# Patient Record
Sex: Male | Born: 1960 | Race: White | Hispanic: No | Marital: Married | Smoking: Never smoker
Health system: Southern US, Community
[De-identification: ages and names within clinical notes are randomized; demographics above are authoritative.]

## PROBLEM LIST (undated history)

## (undated) DIAGNOSIS — R079 Chest pain, unspecified: Secondary | ICD-10-CM

## (undated) DIAGNOSIS — I10 Essential (primary) hypertension: Secondary | ICD-10-CM

## (undated) DIAGNOSIS — Z8249 Family history of ischemic heart disease and other diseases of the circulatory system: Secondary | ICD-10-CM

## (undated) DIAGNOSIS — E7800 Pure hypercholesterolemia, unspecified: Secondary | ICD-10-CM

## (undated) HISTORY — DX: Family history of ischemic heart disease and other diseases of the circulatory system: Z82.49

## (undated) HISTORY — DX: Chest pain, unspecified: R07.9

---

## 2002-06-15 ENCOUNTER — Ambulatory Visit (HOSPITAL_BASED_OUTPATIENT_CLINIC_OR_DEPARTMENT_OTHER): Admission: RE | Admit: 2002-06-15 | Discharge: 2002-06-15 | Payer: Self-pay | Admitting: *Deleted

## 2002-07-03 ENCOUNTER — Encounter (HOSPITAL_COMMUNITY): Admission: RE | Admit: 2002-07-03 | Discharge: 2002-10-01 | Payer: Self-pay | Admitting: Internal Medicine

## 2002-07-04 ENCOUNTER — Encounter: Payer: Self-pay | Admitting: Internal Medicine

## 2003-07-29 ENCOUNTER — Emergency Department (HOSPITAL_COMMUNITY): Admission: EM | Admit: 2003-07-29 | Discharge: 2003-07-29 | Payer: Self-pay | Admitting: Emergency Medicine

## 2003-08-01 ENCOUNTER — Emergency Department (HOSPITAL_COMMUNITY): Admission: EM | Admit: 2003-08-01 | Discharge: 2003-08-01 | Payer: Self-pay | Admitting: Emergency Medicine

## 2003-08-02 ENCOUNTER — Observation Stay (HOSPITAL_COMMUNITY): Admission: AD | Admit: 2003-08-02 | Discharge: 2003-08-03 | Payer: Self-pay | Admitting: Urology

## 2003-08-04 ENCOUNTER — Ambulatory Visit (HOSPITAL_COMMUNITY): Admission: EM | Admit: 2003-08-04 | Discharge: 2003-08-04 | Payer: Self-pay | Admitting: Emergency Medicine

## 2006-05-19 ENCOUNTER — Emergency Department (HOSPITAL_COMMUNITY): Admission: EM | Admit: 2006-05-19 | Discharge: 2006-05-19 | Payer: Self-pay | Admitting: Family Medicine

## 2007-03-21 ENCOUNTER — Encounter: Admission: RE | Admit: 2007-03-21 | Discharge: 2007-03-21 | Payer: Self-pay | Admitting: Family Medicine

## 2010-09-12 NOTE — Op Note (Signed)
NAME:  Dennis Hines, Dennis Hines                        ACCOUNT NO.:  [id number]   MEDICAL RECORD NO.:  [id number]                   PATIENT TYPE:  EMS   LOCATION:  ED                                   FACILITY:  [facility]   PHYSICIAN:  Jamison Neighbor, M.D.               DATE OF BIRTH:  15-Dec-1960   DATE OF PROCEDURE:  DATE OF DISCHARGE:                                 OPERATIVE REPORT   PREOPERATIVE DIAGNOSIS:  Left ureteral obstruction, status post recent  ureteroscopy.   POSTOPERATIVE DIAGNOSIS:  Left ureteral obstruction, status post recent  ureteroscopy.   PROCEDURE:  Cystoscopy, left retrograde ureteroscopy, and double-J catheter  insertion.   SURGEON:  Jamison Neighbor, M.D.   ANESTHESIA:  General.   COMPLICATIONS:  None.   DRAINS:  A 6 French x 26 cm double-J catheter.   BRIEF HISTORY:  This 50 year old male underwent ureteroscopy earlier this  week by Dr. Vernie Ammons with apparently successful retrieval of stone material.  The patient apparently had an atraumatic experience, and it was felt that he  could get by with a stent.   At first, the patient did well, but then he began to develop a intractable  pain, which he felt was severe as to what he had prior to removal of the  stone.  The patient was given advice on how to handle this on an outpatient  basis, such as hot soaks, heating pads, anti-inflammatories, etc., but  presented to the emergency room stating that his pain was poorly controlled.  The decision was made to place the stent.  Patient understands the risks and  benefits of the procedure and specifically understands the complications and  discomfort sometimes associated with stents.  Full informed consent was  obtained.   PROCEDURE:  After successful induction of general anesthesia, the patient  was placed in the dorsal lithotomy position, prepped with Betadine, and  draped in the usual sterile fashion.  Cystoscopy was performed.  The urethra  was visualized in its  entirety.  __________ verumontanum.  There was little  if any prostatic enlargement.  The bladder was carefully inspected.  It was  free of any tumor or stones.  Both the orifices were normal in configuration  and location.  The left ureter, of course, did show postsurgical changes.  An attempt at retrograde was unsuccessful, as it was very tight, and a  urethral catheter could not be passed.  Pressure with a guidewire caused the  guidewire to exit the ureter.  The decision was made to place the  ureteroscope and find the appropriate passage.  The ureteroscope was  inserted into the ureter, and the passageway into the upper ureter  identified but it did appear there was clot material in the distal ureter as  well as some mucosa that was partially extracted.  The ureteroscope was  pushed past this point.  This opened everything up very nicely.  The  guidewire was then advanced under direct vision up into the kidney, where it  coiled normally.  The ureteroscope was withdrawn.  The cystoscope was back-  loaded over the wire, and the 6 French x 26 cm double-J catheter was passed  up to the kidney.  This was positioned so that it sat within the  pelvis as well as within the bladder.  The bladder was drained.  The patient  tolerated the procedure and was taken to the recovery room in good  condition.  He will be given a prescription for Lorcet Plus, Pyridium Plus,  and Septra DS and return to see Dr. Vernie Ammons in followup.                                               Jamison Neighbor, M.D.    RJE/MEDQ  D:  08/04/2003  T:  08/04/2003  Job:  [id number]   cc:   Veverly Fells. Vernie Ammons, M.D.  [address]  [place]  [place] [address]  Fax: [phone]

## 2010-09-12 NOTE — Op Note (Signed)
NAME:  DINNIS, ROG                        ACCOUNT NO.:  [id number]   MEDICAL RECORD NO.:  [id number]                   PATIENT TYPE:  OBV   LOCATION:  0484                                 FACILITY:  [facility]   PHYSICIAN:  Excell Seltzer. Annabell Howells, M.D.                 DATE OF BIRTH:  01/15/1961   DATE OF PROCEDURE:  08/02/2003  DATE OF DISCHARGE:                                 OPERATIVE REPORT   OPERATION/PROCEDURE:  Left ureteroscopic stone extraction.   PREOPERATIVE DIAGNOSIS:  Left urethrovesical junction stone.   POSTOPERATIVE DIAGNOSIS:  Left urethrovesical junction stone.   OPERATION/PROCEDURE:   ANESTHESIA:  General.   COMPLICATIONS:  None.   INDICATIONS:  Lorin Picket is a 50 year old white male with a left distal ureteral  stone which has caused fairly persistent symptoms over the last several  days.  He attempted conservative therapy to no avail and presented today  with increased pain and fever of 100.7.  It was felt that ureteroscopic  stone extraction was indicated.   DESCRIPTION OF PROCEDURE:  The patient was given Rocephin preoperatively,  was taken to the operating room where general anesthesia was induced.  He  was placed in the lithotomy position.  His perineum and genitalia were  prepped with Betadine solution and draped in the usual sterile fashion.   Cystoscopy was performed using a 22-French scope and a 12-degree lens,  examination revealed a normal urethra.  The external sphincter was intact.  The prostatic urethra was short without obstruction.  The bladder wall was  __________ without tumors, stones or inflammation.  No stones were seen in  the bladder.  Ureteral orifices were unremarkable.  The cystoscope was then  removed and a 6.4-French long ureteroscope was passed to the ureteral  orifice.  A guidewire was passed through the scope, up the ureteral orifice.  An attempt was made to pass the scope.  However, it was not successful due  to some narrowing of the  orifice.  I then removed the scope and passed the  inner sheath of 12/14 dilator over the wire, up the ureteral orifice to  dilate.  The ureteroscope was then passed alongside the wire.  The stone was  visualized without difficulty, grasped with a nitinol basket and removed  without any resistance.  The guidewire was then removed.  It was not felt  that a stent was necessary due to the minimal trauma associated with the  stone removal.  The cystoscope sheath was then reinserted.  The bladder was  drained.  The scope  was removed.  The patient was taken down from the lithotomy position.  His  anesthetic was reversed.  He was admitted to the recovery room in stable  condition.  There were no complications.  He will be kept overnight because  of his history of sleep apnea.  Excell Seltzer. Annabell Howells, M.D.    JJW/MEDQ  D:  08/02/2003  T:  08/03/2003  Job:  [id number]   cc:   Ernestina Penna, M.D.  [address] [place]  [place] [address]  Fax: [phone]

## 2011-04-27 ENCOUNTER — Emergency Department (HOSPITAL_COMMUNITY)
Admission: EM | Admit: 2011-04-27 | Discharge: 2011-04-27 | Disposition: A | Discharge status: Home / Self Care | Payer: Worker's Compensation | Attending: Emergency Medicine | Admitting: Emergency Medicine

## 2011-04-27 ENCOUNTER — Encounter: Payer: Self-pay | Admitting: Neurology

## 2011-04-27 DIAGNOSIS — E119 Type 2 diabetes mellitus without complications: Secondary | ICD-10-CM | POA: Insufficient documentation

## 2011-04-27 DIAGNOSIS — S51819A Laceration without foreign body of unspecified forearm, initial encounter: Secondary | ICD-10-CM

## 2011-04-27 DIAGNOSIS — S51809A Unspecified open wound of unspecified forearm, initial encounter: Secondary | ICD-10-CM | POA: Insufficient documentation

## 2011-04-27 DIAGNOSIS — W3189XA Contact with other specified machinery, initial encounter: Secondary | ICD-10-CM | POA: Insufficient documentation

## 2011-04-27 DIAGNOSIS — E7800 Pure hypercholesterolemia, unspecified: Secondary | ICD-10-CM | POA: Insufficient documentation

## 2011-04-27 DIAGNOSIS — I10 Essential (primary) hypertension: Secondary | ICD-10-CM | POA: Insufficient documentation

## 2011-04-27 HISTORY — DX: Essential (primary) hypertension: I10

## 2011-04-27 HISTORY — DX: Pure hypercholesterolemia, unspecified: E78.00

## 2011-04-27 MED ORDER — AMOXICILLIN-POT CLAVULANATE 875-125 MG PO TABS: 1.0000 | ORAL_TABLET | Freq: Two times a day (BID) | ORAL | Status: AC

## 2011-04-27 MED ORDER — HYDROCODONE-ACETAMINOPHEN 5-325 MG PO TABS: 1.0000 | ORAL_TABLET | Freq: Four times a day (QID) | ORAL | Status: AC | PRN

## 2011-04-27 NOTE — ED Notes (Signed)
PA Thayer Ohm, at bedside to suture

## 2011-04-27 NOTE — ED Notes (Signed)
Pt has laceration to right arm as result of meat saw. Pt reporting reached across saw and cut arm. Pt initially controlling bleeding with paper towel. Paper towels removed covered with gauze and wrapped loosely with Kerlex. Pt reports taking aspirin daily. Pt alert and oriented. Talking appropriately.

## 2011-04-27 NOTE — ED Provider Notes (Signed)
Medical screening examination/treatment/procedure(s) were performed by non-physician practitioner and as supervising physician I was immediately available for consultation/collaboration.  Toy Baker, MD 04/27/11 [phone]

## 2011-04-27 NOTE — ED Provider Notes (Signed)
History     CSN: [id number]  Arrival date & time 04/27/11  0865   First MD Initiated Contact with Patient 04/27/11 1013      Chief Complaint  Patient presents with  . Extremity Laceration    (Consider location/radiation/quality/duration/timing/severity/associated sxs/prior treatment) HPI Presents the emergency Dept. after a laceration from a meat saw.  Patient states that his tetanus shot is up-to-date.  Patient denies any numbness in his hand or weakness of the hand.  The laceration is located to the right lateral forearm.  There is minimal bleeding from the wound at this time.  Patient applied pressure before coming to the emergency department.  Past Medical History  Diagnosis Date  . Diabetes mellitus   . Hypertension   . Hypercholesteremia     History reviewed. No pertinent past surgical history.  No family history on file.  History  Substance Use Topics  . Smoking status: Never Smoker   . Smokeless tobacco: Not on file  . Alcohol Use: No      Review of Systems All pertinent positives and negatives reviewed in the history of present illness   Allergies  Review of patient's allergies indicates no known allergies.  Home Medications   Current Outpatient Rx  Name Route Sig Dispense Refill  . ASPIRIN 81 MG PO TABS Oral Take 81 mg by mouth daily.      . BUPROPION HCL ER (XL) 150 MG PO TB24 Oral Take 150 mg by mouth daily.      Marland Kitchen GLIMEPIRIDE 4 MG PO TABS Oral Take 4 mg by mouth daily before breakfast.      . LEVOCETIRIZINE DIHYDROCHLORIDE 5 MG PO TABS Oral Take 5 mg by mouth every evening.      Marland Kitchen LISINOPRIL-HYDROCHLOROTHIAZIDE 20-25 MG PO TABS Oral Take 1 tablet by mouth daily.      Marland Kitchen METFORMIN HCL 1000 MG PO TABS Oral Take 1,000 mg by mouth 2 (two) times daily with a meal.      . PRAVASTATIN SODIUM 20 MG PO TABS Oral Take 20 mg by mouth daily.      Marland Kitchen SITAGLIPTIN PHOSPHATE 100 MG PO TABS Oral Take 100 mg by mouth daily.        BP 160/89  Pulse 87  Temp(Src)  97.8 F (36.6 C) (Oral)  Resp 16  SpO2 96%  Physical Exam  Constitutional: He is oriented to person, place, and time. He appears well-developed and well-nourished. No distress.  HENT:  Head: Normocephalic and atraumatic.  Cardiovascular: Normal rate, regular rhythm and normal heart sounds.   Pulmonary/Chest: Effort normal and breath sounds normal.  Musculoskeletal:       Arms: Neurological: He is alert and oriented to person, place, and time.    ED Course  Procedures (including critical care time)    LACERATION REPAIR Performed by: Carlyle Dolly Authorized by: Carlyle Dolly Consent: Verbal consent obtained. Risks and benefits: risks, benefits and alternatives were discussed Consent given by: patient Patient identity confirmed: provided demographic data Prepped and Draped in normal sterile fashion Wound explored  Laceration Location: Right lateral forearm  Laceration Length: 9cm  No Foreign Bodies seen or palpated  Anesthesia: local infiltration  Local anesthetic: lidocaine 2% w epinephrine  Anesthetic total:12 ml  Irrigation method: syringe Amount of cleaning: standard  Skin closure: Simple interrupted with deep sutures placed x3.  Also placed one suture into a bleeding small vessel and hemostasis was achieved   Number of sutures: 16 +3 deep subcuticular stitches   Technique:  Patient tolerance: Patient tolerated the procedure well with no immediate complications.      MDM  The patient has a fairly complicated laceration that was repaired with good alignment of the flap.  Advised the patient of the likelihood of scarring based on the mechanism of this injury.  He understands that there is a high risk for scarring with this wound.  A nd scar reduction techniques such as Mederma were discussed.  Patient is told to clean gently with warm soap and water dry well.  Patient voices a clear understanding of the plan.  He is told to return here for any  worsening in his condition        Carlyle Dolly, PA-C 04/27/11 1347

## 2014-05-09 ENCOUNTER — Telehealth: Payer: Self-pay | Admitting: Cardiovascular Disease

## 2014-05-09 NOTE — Telephone Encounter (Signed)
[facility]Dr Antony HasteMichael Badger) for appointment with Dr Allyson SabalBerry on 05/11/14.  [facility] for Dr Hazle CocaBerry's schedule on 05/11/14.  lp

## 2014-05-11 ENCOUNTER — Encounter: Payer: Self-pay | Admitting: Cardiovascular Disease

## 2014-05-11 ENCOUNTER — Ambulatory Visit (INDEPENDENT_AMBULATORY_CARE_PROVIDER_SITE_OTHER): Payer: BLUE CROSS/BLUE SHIELD | Admitting: Cardiovascular Disease

## 2014-05-11 ENCOUNTER — Telehealth (HOSPITAL_COMMUNITY): Payer: Self-pay

## 2014-05-11 VITALS — BP 158/82 | HR 85 | Ht 70.0 in | Wt 238.0 lb

## 2014-05-11 DIAGNOSIS — R079 Chest pain, unspecified: Secondary | ICD-10-CM

## 2014-05-11 DIAGNOSIS — E119 Type 2 diabetes mellitus without complications: Secondary | ICD-10-CM | POA: Insufficient documentation

## 2014-05-11 DIAGNOSIS — E785 Hyperlipidemia, unspecified: Secondary | ICD-10-CM

## 2014-05-11 DIAGNOSIS — Z79899 Other long term (current) drug therapy: Secondary | ICD-10-CM

## 2014-05-11 DIAGNOSIS — I209 Angina pectoris, unspecified: Secondary | ICD-10-CM

## 2014-05-11 DIAGNOSIS — I10 Essential (primary) hypertension: Secondary | ICD-10-CM

## 2014-05-11 NOTE — Assessment & Plan Note (Signed)
History of hypertension with blood pressure measured at 158/82. He is on lisinopril hydrochlorothiazide. Continue current meds at current dosing

## 2014-05-11 NOTE — Assessment & Plan Note (Signed)
[name] has multiple cardiac risk factors including family history, hypertension, hyperlipidemia and diabetes. He's had 3 episodes of prolonged chest pain over the last several months the last one being just before Christmas lasting 30-45 minutes associated with shortness of breath and diaphoresis. I am going to get an exercise Myoview stress test to rule out an ischemic etiology.

## 2014-05-11 NOTE — Progress Notes (Signed)
05/11/2014 Dennis Hines   Sep 25, 1960  [id number]  Primary Physician No primary care provider on file. Primary Cardiologist: Dennis Gess MD Dennis Hines   HPI:  [name] is a 54 year old mildly overweight married Caucasian male father of 2 this father Dennis Hines is also a patient of mine. He was referred by Dr. Cyndia Hines for cardiovascular evaluation because of chest pain. His cardiovascular risk factor profile is notable for a family history of heart disease father has had coronary interventions in the past. He has a history of treated hypertension, diabetes. He does have hyperlipidemia intolerant to statin drugs. He has had 3 episodes of chest pain over the last several months last one being prior to Christmas. These are characterized by associated shortness of breath and diaphoresis lasting 30-45 minutes.   Current Outpatient Prescriptions  Medication Sig Dispense Refill  . aspirin 81 MG tablet Take 81 mg by mouth daily.      Marland Kitchen buPROPion (WELLBUTRIN XL) 300 MG 24 hr tablet Take 300 mg by mouth daily.  3  . Cholecalciferol (VITAMIN D3) 2000 UNITS TABS Take 2,000 Units by mouth daily.    Marland Kitchen glimepiride (AMARYL) 4 MG tablet Take 4 mg by mouth daily before breakfast.      . lisinopril-hydrochlorothiazide (PRINZIDE,ZESTORETIC) 20-25 MG per tablet Take 1 tablet by mouth daily.      . metFORMIN (GLUCOPHAGE) 1000 MG tablet Take 1,000 mg by mouth 2 (two) times daily with a meal.      . omeprazole (PRILOSEC) 20 MG capsule Take 20 mg by mouth daily.   11  . testosterone cypionate (DEPOTESTOTERONE CYPIONATE) 200 MG/ML injection Inject 200 mg into the muscle.    . TRADJENTA 5 MG TABS tablet Take 5 mg by mouth daily.   3   No current facility-administered medications for this visit.    No Known Allergies  History   Social History  . Marital Status: Married    Spouse Name: N/A    Number of Children: N/A  . Years of Education: N/A   Occupational History  . Not on file.    Social History Main Topics  . Smoking status: Never Smoker   . Smokeless tobacco: Not on file  . Alcohol Use: No  . Drug Use: No  . Sexual Activity: Not on file   Other Topics Concern  . Not on file   Social History Narrative     Review of Systems: General: negative for chills, fever, night sweats or weight changes.  Cardiovascular: negative for chest pain, dyspnea on exertion, edema, orthopnea, palpitations, paroxysmal nocturnal dyspnea or shortness of breath Dermatological: negative for rash Respiratory: negative for cough or wheezing Urologic: negative for hematuria Abdominal: negative for nausea, vomiting, diarrhea, bright red blood per rectum, melena, or hematemesis Neurologic: negative for visual changes, syncope, or dizziness All other systems reviewed and are otherwise negative except as noted above.    Blood pressure 158/82, pulse 85, height  (1.778 m), weight 238 lb (107.956 kg).  General appearance: alert and no distress Neck: no adenopathy, no carotid bruit, no JVD, supple, symmetrical, trachea midline and thyroid not enlarged, symmetric, no tenderness/mass/nodules Lungs: clear to auscultation bilaterally Heart: regular rate and rhythm, S1, S2 normal, no murmur, click, rub or gallop Extremities: extremities normal, atraumatic, no cyanosis or edema and 2+ pedal pulses bilaterally  EKG normal sinus rhythm at 85 without ST or T-wave changes. I personally reviewed this EKG  ASSESSMENT AND PLAN:   Chest pain Mr.  Dennis Hines has multiple cardiac risk factors including family history, hypertension, hyperlipidemia and diabetes. He's had 3 episodes of prolonged chest pain over the last several months the last one being just before Christmas lasting 30-45 minutes associated with shortness of breath and diaphoresis. I am going to get an exercise Myoview stress test to rule out an ischemic etiology.   Hyperlipidemia History of hyperlipidemia intolerant to statin drugs.  We will recheck a lipid liver profile and explore PCSK9  treatment   Essential hypertension History of hypertension with blood pressure measured at 158/82. He is on lisinopril hydrochlorothiazide. Continue current meds at current dosing       Dennis GessJonathan J. Hines Cradle MD [facility] CenterFACP,FACC,FAHA, [facility] CenterFSCAI 05/11/2014 9:59 AM

## 2014-05-11 NOTE — Assessment & Plan Note (Signed)
History of hyperlipidemia intolerant to statin drugs. We will recheck a lipid liver profile and explore PCSK9  treatment

## 2014-05-11 NOTE — Patient Instructions (Signed)
Your physician has requested that you have en exercise stress myoview. For further information please visit [email or web address]. Please follow instruction sheet, as given.  Your physician recommends that you return for lab work FASTING  Your physician recommends that you schedule a follow-up appointment after your test is completed.

## 2014-05-11 NOTE — Telephone Encounter (Signed)
Encounter complete. 

## 2014-05-14 LAB — HEPATIC FUNCTION PANEL
ALK PHOS: 60 U/L (ref 39–117)
ALT: 30 U/L (ref 0–53)
AST: 21 U/L (ref 0–37)
Albumin: 4.3 g/dL (ref 3.5–5.2)
Bilirubin, Direct: 0.1 mg/dL (ref 0.0–0.3)
Indirect Bilirubin: 0.3 mg/dL (ref 0.2–1.2)
Total Bilirubin: 0.4 mg/dL (ref 0.2–1.2)
Total Protein: 7.1 g/dL (ref 6.0–8.3)

## 2014-05-14 LAB — LIPID PANEL
Cholesterol: 273 mg/dL — ABNORMAL HIGH (ref 0–200)
HDL: 41 mg/dL (ref >39)
LDL Cholesterol: 194 mg/dL — ABNORMAL HIGH (ref 0–99)
Total CHOL/HDL Ratio: 6.7 Ratio
Triglycerides: 190 mg/dL — ABNORMAL HIGH (ref <150)
VLDL: 38 mg/dL (ref 0–40)

## 2014-05-15 ENCOUNTER — Ambulatory Visit (HOSPITAL_COMMUNITY)
Admission: RE | Admit: 2014-05-15 | Discharge: 2014-05-15 | Disposition: A | Discharge status: Home / Self Care | Payer: BLUE CROSS/BLUE SHIELD | Source: Ambulatory Visit | Attending: Cardiology | Admitting: Cardiology

## 2014-05-15 DIAGNOSIS — I10 Essential (primary) hypertension: Secondary | ICD-10-CM | POA: Diagnosis not present

## 2014-05-15 DIAGNOSIS — R079 Chest pain, unspecified: Secondary | ICD-10-CM

## 2014-05-15 DIAGNOSIS — E669 Obesity, unspecified: Secondary | ICD-10-CM | POA: Insufficient documentation

## 2014-05-15 DIAGNOSIS — E785 Hyperlipidemia, unspecified: Secondary | ICD-10-CM | POA: Diagnosis not present

## 2014-05-15 DIAGNOSIS — R002 Palpitations: Secondary | ICD-10-CM | POA: Diagnosis not present

## 2014-05-15 DIAGNOSIS — R5383 Other fatigue: Secondary | ICD-10-CM | POA: Diagnosis not present

## 2014-05-15 DIAGNOSIS — Z8249 Family history of ischemic heart disease and other diseases of the circulatory system: Secondary | ICD-10-CM | POA: Diagnosis not present

## 2014-05-15 DIAGNOSIS — R0609 Other forms of dyspnea: Secondary | ICD-10-CM | POA: Insufficient documentation

## 2014-05-15 MED ORDER — TECHNETIUM TC 99M SESTAMIBI GENERIC - CARDIOLITE
32.0000 | Freq: Once | INTRAVENOUS | Status: AC | PRN
  Administered 2014-05-15: 32 via INTRAVENOUS

## 2014-05-15 MED ORDER — TECHNETIUM TC 99M SESTAMIBI GENERIC - CARDIOLITE
10.6000 | Freq: Once | INTRAVENOUS | Status: AC | PRN
  Administered 2014-05-15: 11 via INTRAVENOUS

## 2014-05-15 NOTE — Procedures (Addendum)
[facility] [address] [place] [address] [phone]  Cardiology Nuclear Med Study  Dennis Hines is a 54 y.o. male     MRN : [id number]     DOB: 07-Mar-1961  Procedure Date: 05/15/2014  Nuclear Med Background Indication for Stress Test:  Evaluation for Ischemia History:  No prior cardiac or respiratory history reported; [facility]. Cardiac Risk Factors: Family History - CAD, Hypertension, Lipids and Overweight  Symptoms:  Chest Pain, DOE, Fatigue, Palpitations and SOB   Nuclear Pre-Procedure Caffeine/Decaff Intake:  1:00am NPO After: 11am   IV Site: R Forearm  IV 0.9% NS with Angio Cath:  22g  Chest Size (in):  50" IV Started by: Berdie OgrenAmanda Wease, RN  Height: 5\' 10"  (1.778 m)  Cup Size: n/a  BMI:  Body mass index is 34.15 kg/(m^2). Weight:  238 lb (107.956 kg)   Tech Comments:  n/a    Nuclear Med Study 1 or 2 day study: 1 day  Stress Test Type:  Stress  Order Authorizing Provider:  Nanetta BattyJonathan Berry, MD   Resting Radionuclide: Technetium 7974m Sestamibi  Resting Radionuclide Dose: 10.6 mCi   Stress Radionuclide:  Technetium 8674m Sestamibi  Stress Radionuclide Dose: 32.0 mCi           Stress Protocol Rest HR: 90 Stress HR: 153  Rest BP: 147/90 Stress BP: 200/116  Exercise Time (min): 7:48 METS: 9.7   Predicted Max HR: 169 bpm % Max HR: 90.53 bpm Rate Pressure Product: [id number]  Dose of Adenosine (mg):  n/a Dose of Lexiscan: n/a mg  Dose of Atropine (mg): n/a Dose of Dobutamine: n/a mcg/kg/min (at max HR)  Stress Test Technologist: Esperanza Sheetserry-Marie Martin, CCT Nuclear Technologist: Gonzella LexPam Phillips, CNMT   Rest Procedure:  Myocardial perfusion imaging was performed at rest 45 minutes following the intravenous administration of Technetium 3874m Sestamibi. Stress Procedure:  The patient performed treadmill exercise using a Bruce  Protocol for 7:48 minutes. The patient stopped due to Diastolic  HTN  and denied any chest pain.  There were no significant ST-T wave changes.  Technetium 3774m Sestamibi was injected at peak exercise and myocardial perfusion imaging was performed after a brief delay.  Transient Ischemic Dilatation (Normal <1.22):  1.17 QGS EDV:  121 ml QGS ESV:  58 ml LV Ejection Fraction: 52%  PHYSICIAN INTERPRETATION   Rest ECG: NSR with non-specific ST-T wave changes  Stress ECG: No significant change from baseline ECG and No significant ST segment change suggestive of ischemia.  QPS Raw Data Images:  Mild diaphragmatic attenuation.  Normal left ventricular size. Stress Images:  There is decreased uptake in the apex. There is partial reversibility in this area. Small sized, mild intensity partially reversible  Perfusion defect - cannot exclude ischemia. Rest Images:  There is decreased uptake in the apex.  There is mild apical thinning with normal uptake in other regions. Subtraction (SDS):  These findings are consistent with ischemia. Mild area of apical possible apical (apical septal) ischemia cannot be excluded. Clinical correlation recommended. LV Wall Motion:  NL LV Function; NL Wall Motion  Impression Exercise Capacity:  Fair exercise capacity. BP Response:  Hypertensive blood pressure response. Clinical Symptoms:  fatigue and mild dyspnea. NO angina -- Negative TM Stress Test. Duke TM Score ~+8 ECG Impression:  No significant ST segment change suggestive of ischemia. Comparison with Prior Nuclear Study: No images to compare  Overall Impression:  Low risk stress  nuclear study small sized, mild intensity apical septal perfusion defect.   Marykay Lex, MD  05/16/2014 5:54 PM

## 2014-05-25 ENCOUNTER — Ambulatory Visit (INDEPENDENT_AMBULATORY_CARE_PROVIDER_SITE_OTHER): Payer: BLUE CROSS/BLUE SHIELD | Admitting: Pharmacist Clinician (PhC)/ Clinical Pharmacy Specialist

## 2014-05-25 VITALS — Ht 70.0 in | Wt 238.0 lb

## 2014-05-25 DIAGNOSIS — I10 Essential (primary) hypertension: Secondary | ICD-10-CM

## 2014-05-25 NOTE — Patient Instructions (Signed)
Start zetia 10 mg once daily.  If you develop any muscle weakness stop for 3-4 days then restart at 1/2 tablet daily.  I will call you in about 3-4 weeks to see how you are tolerating this.  If all is well we will start you on Crestor 10 mg once weekly.  Try to decrease the amount of "white" foods in your diet, as well as sweet tea.  Increase the amount of fiber you eat as well.    Increase exercise - try to do some walking every day.

## 2014-05-27 ENCOUNTER — Encounter: Payer: Self-pay | Admitting: Pharmacist Clinician (PhC)/ Clinical Pharmacy Specialist

## 2014-05-27 MED ORDER — EZETIMIBE 10 MG PO TABS: 10.0000 mg | ORAL_TABLET | Freq: Every day | ORAL | Status: DC

## 2014-05-27 NOTE — Progress Notes (Signed)
05/27/2014 Dennis Hines 04-26-1961 [id number]   HPI:  Dennis Hines is a 54 y.o. male patient of Dr Allyson SabalBerry, who presents today for a lipid clinic evaluation.  He has previously had cholesterol managed by [facility] and has been intolerant to statin drugs.  They caused muscle aches and weakness.    RF:  HTN, DM, hyperlipidemia Meds: nothing for cholesterol   Intolerant: statins (unsure of which ones or doses)  Family history:  Father has ASCVD, first MI at 7256; brother has irregular heart rhythm  Diet:  Bacon/eggs 2-3 times per week, other wise pop-tarts or raisin bran for breakfast; lunch if any is take out/fast food; dinner includes all meats, lots of potatoes; not much in the way of fresh fruits or vegetables; sweet tea daily  Exercise:  Was very active before retirement from [employer], now walks 30-40 min 2-3 times per week  Labs:  Results for Dennis Hines, Dennis L (MRN [id number]) as of 05/27/2014 12:56  Ref. Range 05/14/2014 11:14  Cholesterol Latest Range: 0-200 mg/dL 782273 (H)  Triglycerides Latest Range: <150 mg/dL 956190 (H)  HDL Latest Range: >39 mg/dL 41  LDL (calc) Latest Range: 0-99 mg/dL 213194 (H)  VLDL Latest Range: 0-40 mg/dL 38  Total CHOL/HDL Ratio Latest Units: Ratio 6.7      Current Outpatient Prescriptions  Medication Sig Dispense Refill  . aspirin 81 MG tablet Take 81 mg by mouth daily.      Marland Kitchen. buPROPion (WELLBUTRIN XL) 300 MG 24 hr tablet Take 300 mg by mouth daily.  3  . Cholecalciferol (VITAMIN D3) 2000 UNITS TABS Take 2,000 Units by mouth daily.    Marland Kitchen. glimepiride (AMARYL) 4 MG tablet Take 4 mg by mouth daily before breakfast.      . lisinopril-hydrochlorothiazide (PRINZIDE,ZESTORETIC) 20-25 MG per tablet Take 1 tablet by mouth daily.      . metFORMIN (GLUCOPHAGE) 1000 MG tablet Take 1,000 mg by mouth 2 (two) times daily with a meal.      . omeprazole (PRILOSEC) 20 MG capsule Take 20 mg by mouth daily.   11  . testosterone cypionate  (DEPOTESTOTERONE CYPIONATE) 200 MG/ML injection Inject 200 mg into the muscle.    . TRADJENTA 5 MG TABS tablet Take 5 mg by mouth daily.   3   No current facility-administered medications for this visit.    Allergies  Allergen Reactions  . Statins Other (See Comments)    Muscles aches and pain with weakness.    Past Medical History  Diagnosis Date  . Diabetes mellitus   . Hypertension   . Hypercholesteremia   . Family history of heart disease   . Chest pain     Height 5\' 10"  (1.778 m), weight 238 lb (107.956 kg).    Phillips HayKristin Alvstad PharmD CPP [facility]

## 2014-05-27 NOTE — Assessment & Plan Note (Signed)
Although his LDL is quite high at 194, he does not appear to fit the profile of familial hypercholesterolemia, as his [place] Score is 3.  And because he is still primary prevention, he does not qualify for consideration of a PCSK-9 inhibitor at this time.  Reviewed this information with patient.  He has never tried Zetia before, nor low dose once weekly statin.  Today I will start him on Zetia 10 mg once daily.  I will check in with him in 3-4 weeks to see if he is tolerating it.  If he is good at that time we will add Crestor 10 mg once weekly.  If he can tolerate both of these meds for 10-12 weeks i will repeat lipid labs and we can then adjust treatment accordingly.

## 2014-05-29 ENCOUNTER — Encounter: Payer: Self-pay | Admitting: Cardiovascular Disease

## 2014-05-29 ENCOUNTER — Ambulatory Visit (INDEPENDENT_AMBULATORY_CARE_PROVIDER_SITE_OTHER): Payer: BLUE CROSS/BLUE SHIELD | Admitting: Cardiovascular Disease

## 2014-05-29 ENCOUNTER — Ambulatory Visit
Admission: RE | Admit: 2014-05-29 | Discharge: 2014-05-29 | Disposition: A | Discharge status: Home / Self Care | Payer: BLUE CROSS/BLUE SHIELD | Source: Ambulatory Visit | Attending: Cardiovascular Disease | Admitting: Cardiovascular Disease

## 2014-05-29 VITALS — BP 148/92 | HR 98 | Ht 70.0 in | Wt 237.0 lb

## 2014-05-29 DIAGNOSIS — D689 Coagulation defect, unspecified: Secondary | ICD-10-CM

## 2014-05-29 DIAGNOSIS — R079 Chest pain, unspecified: Secondary | ICD-10-CM

## 2014-05-29 DIAGNOSIS — R5383 Other fatigue: Secondary | ICD-10-CM

## 2014-05-29 DIAGNOSIS — Z01818 Encounter for other preprocedural examination: Secondary | ICD-10-CM

## 2014-05-29 DIAGNOSIS — R9439 Abnormal result of other cardiovascular function study: Secondary | ICD-10-CM

## 2014-05-29 LAB — PROTIME-INR
INR: 0.93 (ref <1.50)
Prothrombin Time: 12.5 seconds (ref 11.6–15.2)

## 2014-05-29 LAB — CBC
HEMATOCRIT: 45.9 % (ref 39.0–52.0)
HEMOGLOBIN: 15.6 g/dL (ref 13.0–17.0)
MCH: 28.3 pg (ref 26.0–34.0)
MCHC: 34 g/dL (ref 30.0–36.0)
MCV: 83.2 fL (ref 78.0–100.0)
MPV: 10.3 fL (ref 8.6–12.4)
Platelets: 248 10*3/uL (ref 150–400)
RBC: 5.52 MIL/uL (ref 4.22–5.81)
RDW: 14.1 % (ref 11.5–15.5)
WBC: 6.6 10*3/uL (ref 4.0–10.5)

## 2014-05-29 LAB — APTT: aPTT: 32 seconds (ref 24–37)

## 2014-05-29 MED ORDER — METOPROLOL TARTRATE 25 MG PO TABS: 25.0000 mg | ORAL_TABLET | Freq: Two times a day (BID) | ORAL | Status: DC

## 2014-05-29 NOTE — Progress Notes (Signed)
[name] returns today for follow-up of his Myoview stress test. This showed mild inferior ischemia.he has continued to have chest pain and shortness of breath. Because of his multiple cardiac risk factors we have decided to proceed with outpatient diagnostic coronary arteriography to define his anatomy and rule out an ischemic etiology.   Runell GessJonathan J. Malaisha Silliman, M.D., FACP, [facility]ACC, Earl LagosFAHA, [facility] [address] [place], [place]  [address]  [phone] 05/29/2014 2:43 PM

## 2014-05-29 NOTE — Patient Instructions (Signed)
START taking Lopressor 25 mg, two times a day  Your physician has requested that you have a cardiac catheterization (Right Radial on Monday, Feb 8) . Cardiac catheterization is used to diagnose and/or treat various heart conditions. Doctors may recommend this procedure for a number of different reasons. The most common reason is to evaluate chest pain. Chest pain can be a symptom of coronary artery disease (CAD), and cardiac catheterization can show whether plaque is narrowing or blocking your heart's arteries. This procedure is also used to evaluate the valves, as well as measure the blood flow and oxygen levels in different parts of your heart. For further information please visit [email or web address].   Following your catheterization, you will not be allowed to drive for 3 days.  No lifting, pushing, or pulling greater that 10 pounds is allowed for 1 week.  You will be required to have the following tests prior to the procedure:  1. Blood work-the blood work can be done no more than 7 days prior to the procedure.  It can be done at any [facility] lab.  There is one downstairs on the first floor of this building and one in the [facility] ([address])  2. Chest Xray-the chest xray order has already been placed at the [facility].

## 2014-05-29 NOTE — Assessment & Plan Note (Signed)
Patient was originally referred to me for chest pain. He has multiple cardiac risk factors. A Myoview stress test showed mild inferior ischemia. Patient was, he decided to proceed with outpatient diagnostic coronary arteriography via the right radial approach. I thoroughly explained the risks and benefits the patient and his wife.

## 2014-05-29 NOTE — Assessment & Plan Note (Signed)
His blood pressure is 140/92 today with a heart rate of 98. I'm going to begin him on Lopressor 25 mg by mouth twice a day.

## 2014-05-29 NOTE — Assessment & Plan Note (Signed)
[facility] treatment of [facility].he has been statin intolerant in the past. He is begun on Zetia. My intent was to explore the use of PC SK9 monoclonal injectables.

## 2014-05-30 LAB — BASIC METABOLIC PANEL
BUN: 14 mg/dL (ref 6–23)
CALCIUM: 9.9 mg/dL (ref 8.4–10.5)
CO2: 27 meq/L (ref 19–32)
Chloride: 99 mEq/L (ref 96–112)
Creat: 0.87 mg/dL (ref 0.50–1.35)
Glucose, Bld: 172 mg/dL — ABNORMAL HIGH (ref 70–99)
Potassium: 4.2 mEq/L (ref 3.5–5.3)
Sodium: 139 mEq/L (ref 135–145)

## 2014-05-30 LAB — TSH: TSH: 1.147 u[IU]/mL (ref 0.350–4.500)

## 2014-05-31 ENCOUNTER — Telehealth: Payer: Self-pay | Admitting: Cardiovascular Disease

## 2014-05-31 NOTE — Telephone Encounter (Signed)
Dr Allyson SabalBerry added a new blood pressure medicine,does he  need to take both of them?

## 2014-05-31 NOTE — Telephone Encounter (Signed)
Pt put on lopressor, in addition to historic meds. Advised pt not to discontinue any meds, check his BPs and notify us if low. Explained normal systolic and diastolic ranges. Pt voiced understanding.

## 2014-06-01 ENCOUNTER — Telehealth: Payer: Self-pay | Admitting: Cardiovascular Disease

## 2014-06-01 NOTE — Telephone Encounter (Signed)
Spoke with patient regarding procedure time for Monday 06/04/14.  Explained that the time had been changed to 3:00 pm arrival time of 1:00 pm at [facility].  Patient voiced his understanding.

## 2014-06-04 ENCOUNTER — Ambulatory Visit (HOSPITAL_COMMUNITY)
Admission: RE | Admit: 2014-06-04 | Discharge: 2014-06-04 | Disposition: A | Discharge status: Home / Self Care | Payer: BLUE CROSS/BLUE SHIELD | Source: Ambulatory Visit | Attending: Cardiovascular Disease | Admitting: Cardiovascular Disease

## 2014-06-04 ENCOUNTER — Encounter (HOSPITAL_COMMUNITY)
Admission: RE | Disposition: A | Discharge status: Home / Self Care | Payer: Self-pay | Source: Ambulatory Visit | Attending: Cardiovascular Disease

## 2014-06-04 ENCOUNTER — Encounter (HOSPITAL_COMMUNITY): Payer: Self-pay | Admitting: Cardiovascular Disease

## 2014-06-04 DIAGNOSIS — I10 Essential (primary) hypertension: Secondary | ICD-10-CM | POA: Insufficient documentation

## 2014-06-04 DIAGNOSIS — R5383 Other fatigue: Secondary | ICD-10-CM

## 2014-06-04 DIAGNOSIS — R0789 Other chest pain: Secondary | ICD-10-CM | POA: Diagnosis not present

## 2014-06-04 DIAGNOSIS — R079 Chest pain, unspecified: Secondary | ICD-10-CM | POA: Diagnosis present

## 2014-06-04 DIAGNOSIS — E119 Type 2 diabetes mellitus without complications: Secondary | ICD-10-CM | POA: Insufficient documentation

## 2014-06-04 DIAGNOSIS — Z01818 Encounter for other preprocedural examination: Secondary | ICD-10-CM

## 2014-06-04 DIAGNOSIS — R9439 Abnormal result of other cardiovascular function study: Secondary | ICD-10-CM

## 2014-06-04 DIAGNOSIS — E785 Hyperlipidemia, unspecified: Secondary | ICD-10-CM | POA: Diagnosis not present

## 2014-06-04 DIAGNOSIS — D689 Coagulation defect, unspecified: Secondary | ICD-10-CM

## 2014-06-04 HISTORY — PX: LEFT HEART CATHETERIZATION WITH CORONARY ANGIOGRAM: SHX5451

## 2014-06-04 SURGERY — LEFT HEART CATHETERIZATION WITH CORONARY ANGIOGRAM

## 2014-06-04 MED ORDER — FENTANYL CITRATE 0.05 MG/ML IJ SOLN
INTRAMUSCULAR | Status: AC
  Filled 2014-06-04: qty 2

## 2014-06-04 MED ORDER — SODIUM CHLORIDE 0.9 % IV SOLN
INTRAVENOUS | Status: DC
  Administered 2014-06-04: 1000 mL via INTRAVENOUS

## 2014-06-04 MED ORDER — ASPIRIN 81 MG PO CHEW
CHEWABLE_TABLET | ORAL | Status: AC
  Filled 2014-06-04: qty 1

## 2014-06-04 MED ORDER — HEPARIN (PORCINE) IN NACL 2-0.9 UNIT/ML-% IJ SOLN
INTRAMUSCULAR | Status: AC
  Filled 2014-06-04: qty 1000

## 2014-06-04 MED ORDER — ACETAMINOPHEN 325 MG PO TABS: 650.0000 mg | ORAL_TABLET | ORAL | Status: DC | PRN

## 2014-06-04 MED ORDER — MORPHINE SULFATE 2 MG/ML IJ SOLN: 2.0000 mg | INTRAMUSCULAR | Status: DC | PRN

## 2014-06-04 MED ORDER — MIDAZOLAM HCL 2 MG/2ML IJ SOLN
INTRAMUSCULAR | Status: AC
Start: 2014-06-04 — End: 2014-06-04
  Filled 2014-06-04: qty 2

## 2014-06-04 MED ORDER — ONDANSETRON HCL 4 MG/2ML IJ SOLN: 4.0000 mg | Freq: Four times a day (QID) | INTRAMUSCULAR | Status: DC | PRN

## 2014-06-04 MED ORDER — NITROGLYCERIN 1 MG/10 ML FOR IR/CATH LAB
INTRA_ARTERIAL | Status: AC
  Filled 2014-06-04: qty 10

## 2014-06-04 MED ORDER — LIDOCAINE HCL (PF) 1 % IJ SOLN
INTRAMUSCULAR | Status: AC
Start: 2014-06-04 — End: 2014-06-04
  Filled 2014-06-04: qty 30

## 2014-06-04 MED ORDER — ASPIRIN 81 MG PO CHEW
81.0000 mg | CHEWABLE_TABLET | ORAL | Status: AC
  Administered 2014-06-04: 81 mg via ORAL

## 2014-06-04 MED ORDER — VERAPAMIL HCL 2.5 MG/ML IV SOLN
INTRAVENOUS | Status: AC
  Filled 2014-06-04: qty 2

## 2014-06-04 MED ORDER — SODIUM CHLORIDE 0.9 % IJ SOLN: 3.0000 mL | INTRAMUSCULAR | Status: DC | PRN

## 2014-06-04 MED ORDER — HEPARIN SODIUM (PORCINE) 1000 UNIT/ML IJ SOLN
INTRAMUSCULAR | Status: AC
  Filled 2014-06-04: qty 1

## 2014-06-04 MED ORDER — SODIUM CHLORIDE 0.9 % IV SOLN
INTRAVENOUS | Status: AC
Start: 2014-06-04 — End: 2014-06-04
  Administered 2014-06-04: 18:00:00 via INTRAVENOUS

## 2014-06-04 NOTE — Discharge Instructions (Signed)
Radial Site Care   Restart metformin on 06/07/14  Refer to this sheet in the next few weeks. These instructions provide you with information on caring for yourself after your procedure. Your caregiver may also give you more specific instructions. Your treatment has been planned according to current medical practices, but problems sometimes occur. Call your caregiver if you have any problems or questions after your procedure. HOME CARE INSTRUCTIONS  You may shower the day after the procedure.Remove the bandage (dressing) and gently wash the site with plain soap and water.Gently pat the site dry.  Do not apply powder or lotion to the site.  Do not submerge the affected site in water for 3 to 5 days.  Inspect the site at least twice daily.  Do not flex or bend the affected arm for 24 hours.  No lifting over 5 pounds (2.3 kg) for 5 days after your procedure.  Do not drive home if you are discharged the same day of the procedure. Have someone else drive you.  You may drive 24 hours after the procedure unless otherwise instructed by your caregiver.  Do not operate machinery or power tools for 24 hours.  A responsible adult should be with you for the first 24 hours after you arrive home. What to expect:  Any bruising will usually fade within 1 to 2 weeks.  Blood that collects in the tissue (hematoma) may be painful to the touch. It should usually decrease in size and tenderness within 1 to 2 weeks. SEEK IMMEDIATE MEDICAL CARE IF:  You have unusual pain at the radial site.  You have redness, warmth, swelling, or pain at the radial site.  You have drainage (other than a small amount of blood on the dressing).  You have chills.  You have a fever or persistent symptoms for more than 72 hours.  You have a fever and your symptoms suddenly get worse.  Your arm becomes pale, cool, tingly, or numb.  You have heavy bleeding from the site. Hold pressure on the site and call  911. Document Released: 05/16/2010 Document Revised: 07/06/2011 Document Reviewed: 05/16/2010 [facility] Patient Information 2015 [place] [place]. This information is not intended to replace advice given to you by your health care provider. Make sure you discuss any questions you have with your health care provider.

## 2014-06-04 NOTE — Interval H&P Note (Signed)
Cath Lab Visit (complete for each Cath Lab visit)  Clinical Evaluation Leading to the Procedure:   ACS: No.  Non-ACS:    Anginal Classification: CCS III  Anti-ischemic medical therapy: No Therapy  Non-Invasive Test Results: Low-risk stress test findings: cardiac mortality <1%/year  Prior CABG: No previous CABG      History and Physical Interval Note:  06/04/2014 3:18 PM  Dennis Hines  has presented today for surgery, with the diagnosis of positive stress test  The various methods of treatment have been discussed with the patient and family. After consideration of risks, benefits and other options for treatment, the patient has consented to  Procedure(s): LEFT HEART CATHETERIZATION WITH CORONARY ANGIOGRAM (N/A) as a surgical intervention .  The patient's history has been reviewed, patient examined, no change in status, stable for surgery.  I have reviewed the patient's chart and labs.  Questions were answered to the patient's satisfaction.     Runell GessBERRY,Waymond Meador J

## 2014-06-04 NOTE — H&P (View-Only) (Signed)
Mr. Willis Modenaaschail returns today for follow-up of his Myoview stress test. This showed mild inferior ischemia.he has continued to have chest pain and shortness of breath. Because of his multiple cardiac risk factors we have decided to proceed with outpatient diagnostic coronary arteriography to define his anatomy and rule out an ischemic etiology.   Runell GessJonathan J. Berry, M.D., FACP, [facility]FACC, Earl LagosFAHA, [facility] [address] [place], [place]  [address]  [phone] 05/29/2014 2:43 PM

## 2014-06-04 NOTE — CV Procedure (Signed)
Dennis Hines is a 54 y.o. male    [id number] LOCATION:  FACILITY: [facility]  PHYSICIAN: Nanetta BattyJonathan Teddie Curd, M.D. 1961-01-24   DATE OF PROCEDURE:  06/04/2014  DATE OF DISCHARGE:     CARDIAC CATHETERIZATION     History obtained from chart review.Mr. Sabino Niemannaschal is a 54 year old mildly overweight married Caucasian male father of 2 this father Fayrene FearingJames is also a patient of mine. He was referred by Dr. Cyndia BentBadger for cardiovascular evaluation because of chest pain. His cardiovascular risk factor profile is notable for a family history of heart disease father has had coronary interventions in the past. He has a history of treated hypertension, diabetes. He does have hyperlipidemia intolerant to statin drugs. He has had 3 episodes of chest pain over the last several months last one being prior to Christmas. These are characterized by associated shortness of breath and diaphoresis lasting 30-45 minutes. He had a Myoview stress test that showed mild inferoseptal ischemia. Because of this symptoms, risk factors and mild abnormality on stress testing we've discussed proceeding with outpatient diagnostic coronary arteriography. The right radial approach.   PROCEDURE DESCRIPTION:   The patient was brought to the second floor [facility] cath lab in the postabsorptive state. He was premedicated with Valium 5 mg by mouth, IV Versed and fentanyl. His right wrist was prepped and shaved in usual sterile fashion. Xylocaine 1% was used for local anesthesia. A 6 French sheath was inserted into the right radial artery using standard Seldinger technique. The patient received 5000 units  of heparin  intravenously.  A 5 [place]French TIG catheter and pigtail catheters were used for selective coronary angiography and left ventriculography respectively. Visipaque dye was used for the entirety of the case. Retrograde aortic, left ventricular and pullback pressures were recorded.    HEMODYNAMICS:    AO SYSTOLIC/AO DIASTOLIC:  111/77   LV SYSTOLIC/LV DIASTOLIC: 120/9  ANGIOGRAPHIC RESULTS:   1. Left main; normal  2. LAD; normal 3. Left circumflex; dominant and normal.  4. Right coronary artery; nondominant and normal 5. Left ventriculography; RAO left ventriculogram was performed using  25 mL of Visipaque dye at 12 mL/second. The overall LVEF estimated  60 %  Without wall motion abnormalities  IMPRESSION:Mr. Pascall has normal coronary arteries and normal LV function. I believe this chest pain is noncardiac. The sheath was removed and a TR band was placed on the right wrist to achieve patent hemostasis. The patient left the lab in stable condition. He'll be discharged home after several hours and will follow-up with me in several weeks.  Runell GessBERRY,Denita Lun J. MD, [facility] CenterFACC 06/04/2014 3:49 PM

## 2014-06-05 LAB — GLUCOSE, CAPILLARY: Glucose-Capillary: 180 mg/dL — ABNORMAL HIGH (ref 70–99)

## 2014-06-06 ENCOUNTER — Encounter: Payer: Self-pay | Admitting: *Deleted

## 2014-06-26 ENCOUNTER — Other Ambulatory Visit: Payer: Self-pay | Admitting: Pharmacist Clinician (PhC)/ Clinical Pharmacy Specialist

## 2014-06-28 ENCOUNTER — Telehealth: Payer: Self-pay | Admitting: Pharmacist Clinician (PhC)/ Clinical Pharmacy Specialist

## 2014-06-28 NOTE — Telephone Encounter (Signed)
LMOM for patient - need to know if he's tolerating Zetia 10 mg daily, can we add Crestor 10 mg weekly now?  Pt not PCSK-9 eligible at this time (LDL 194)

## 2014-07-03 NOTE — Telephone Encounter (Signed)
LMOM - home phone for pt to return call

## 2014-07-04 ENCOUNTER — Encounter: Payer: Self-pay | Admitting: Cardiology

## 2014-07-04 ENCOUNTER — Ambulatory Visit (INDEPENDENT_AMBULATORY_CARE_PROVIDER_SITE_OTHER): Payer: BLUE CROSS/BLUE SHIELD | Admitting: Cardiology

## 2014-07-04 VITALS — BP 138/84 | HR 62 | Ht 70.0 in | Wt 237.0 lb

## 2014-07-04 DIAGNOSIS — Z9889 Other specified postprocedural states: Secondary | ICD-10-CM | POA: Insufficient documentation

## 2014-07-04 NOTE — Patient Instructions (Signed)
Your physician recommends that you schedule a follow-up appointment in: AS NEEDED with Dr. Allyson SabalBerry.

## 2014-07-04 NOTE — Progress Notes (Signed)
Cardiology Office Note   Date:  07/04/2014   ID:  Dennis Hines, DOB 11-04-1960, MRN [id number]  PCP:  Eartha Inch, MD  Cardiologist:  Dr. Erlene Quan     Chief Complaint  Patient presents with  . post cardiac cath    feb 8th cath. no chest pain or dyspnea. slight swelling in ankles at nights.       History of Present Illness: Dennis Hines is a 54 y.o. male who presents for follow-up after cardiac catheterization.  ACE inhibitor having chest pain and he has risk factors with family history of coronary disease diabetes as well as hyperlipidemia. The stress test revealed mild inferior ischemia and patient was set up for cardiac catheterization. Fortunately the cardiac cath revealed normal coronary arteries.  His EF was 60% he had no wall motion abnormalities.     Today he has no complaints she's had no further chest pain. His cath site is stable his glucose is fairly well controlled and he is exercising.      Past Medical History  Diagnosis Date  . Diabetes mellitus   . Hypertension   . Hypercholesteremia   . Family history of heart disease   . Chest pain     Past Surgical History  Procedure Laterality Date  . Left heart catheterization with coronary angiogram N/A 06/04/2014    Procedure: LEFT HEART CATHETERIZATION WITH CORONARY ANGIOGRAM;  Surgeon: Runell Gess, MD;  [facility] LAB;  Service: Cardiovascular;  Laterality: N/A;     Current Outpatient Prescriptions  Medication Sig Dispense Refill  . aspirin 81 MG tablet Take 81 mg by mouth daily.      Marland Kitchen buPROPion (WELLBUTRIN XL) 300 MG 24 hr tablet Take 300 mg by mouth daily.  3  . Cholecalciferol (VITAMIN D3) 2000 UNITS TABS Take 2,000 Units by mouth daily.    Marland Kitchen ezetimibe (ZETIA) 10 MG tablet Take 1 tablet (10 mg total) by mouth daily. 30 tablet 5  . glimepiride (AMARYL) 4 MG tablet Take 4 mg by mouth daily before breakfast.      . lisinopril-hydrochlorothiazide (PRINZIDE,ZESTORETIC) 20-25 MG per  tablet Take 1 tablet by mouth daily.      . metFORMIN (GLUCOPHAGE) 1000 MG tablet Take 1,000 mg by mouth 2 (two) times daily with a meal.      . metoprolol tartrate (LOPRESSOR) 25 MG tablet Take 1 tablet (25 mg total) by mouth 2 (two) times daily. 60 tablet 5  . omeprazole (PRILOSEC) 20 MG capsule Take 20 mg by mouth daily.   11  . testosterone cypionate (DEPOTESTOTERONE CYPIONATE) 200 MG/ML injection Inject 200 mg into the muscle every 14 (fourteen) days.     . TRADJENTA 5 MG TABS tablet Take 5 mg by mouth daily.   3   No current facility-administered medications for this visit.    Allergies:   Statins    Social History:  The patient  reports that he has never smoked. He does not have any smokeless tobacco history on file. He reports that he does not drink alcohol or use illicit drugs.   Family History:  The patient's family history includes Arrhythmia in his brother; Cancer in his mother; Heart attack in his father; Heart disease in his father.    ROS:  General:no colds or fevers, no weight changes Skin:no rashes or ulcers CV:see HPI PUL:see HPI  Wt Readings from Last 3 Encounters:  07/04/14 237 lb (107.502 kg)  05/29/14 237 lb (107.502 kg)  05/27/14  238 lb (107.956 kg)     PHYSICAL EXAM: VS:  BP 138/84 mmHg  Pulse 62  Ht 5\' 10"  (1.778 m)  Wt 237 lb (107.502 kg)  BMI 34.01 kg/m2 , BMI Body mass index is 34.01 kg/(m^2). General:Pleasant affect, NAD Heart:S1S2 RRR without murmur, gallup, rub or click Lungs:clear without rales, rhonchi, or wheezes Ext:no lower ext edema, 2+ pedal pulses, 2+ radial pulses Neuro:alert and oriented, MAE, follows commands, + facial symmetry    EKG:  EKG is not ordered today.  Recent Labs: 05/14/2014: ALT 30 05/29/2014: BUN 14; Creatinine 0.87; Hemoglobin 15.6; Platelets 248; Potassium 4.2; Sodium 139; TSH 1.147    Lipid Panel    Component Value Date/Time   CHOL 273* 05/14/2014 1114   TRIG 190* 05/14/2014 1114   HDL 41 05/14/2014 1114    CHOLHDL 6.7 05/14/2014 1114   VLDL 38 05/14/2014 1114   LDLCALC 194* 05/14/2014 1114       Other studies Reviewed: Additional studies/ records that were reviewed today include: Cardiac catheterization.   ASSESSMENT AND PLAN:  1.  Chest pain has resolved. Myoview was falsely positive cardiac catheterization revealed normal coronary arteries and normal ejection fraction. While patient is high risk for coronary disease his diabetes hyperlipidemia and hypertension will be monitored through his primary care physician we will certainly be glad to see him in the future if he has any further cardiac issues. Did not make a follow-up appointment his primary care will do this if he feels he needs to see us in the near future.   Current medicines are reviewed with the patient today.  The patient Has no concerns regarding medicines.  The following changes have been made:  See above Labs/ tests ordered today include:see above  Disposition:   FU:  see above  Nyoka LintSigned, Jahleel Stroschein R, NP  07/04/2014 7:03 PM    [facility] [address], [place], [place]  [address]/ [address] [place], [place] Phone: [phone]; Fax: [phone]  [phone]

## 2015-01-31 ENCOUNTER — Other Ambulatory Visit: Payer: Self-pay | Admitting: Cardiovascular Disease

## 2015-01-31 NOTE — Telephone Encounter (Signed)
Rx request sent to pharmacy.  

## 2015-05-30 ENCOUNTER — Other Ambulatory Visit: Payer: Self-pay | Admitting: Cardiovascular Disease

## 2015-05-31 NOTE — Telephone Encounter (Signed)
Rx(s) sent to pharmacy electronically.  

## 2015-08-15 ENCOUNTER — Other Ambulatory Visit: Payer: Self-pay | Admitting: Cardiovascular Disease

## 2015-09-17 ENCOUNTER — Other Ambulatory Visit: Payer: Self-pay | Admitting: Cardiovascular Disease

## 2015-09-17 NOTE — Telephone Encounter (Signed)
Rx(s) sent to pharmacy electronically.  

## 2015-10-24 ENCOUNTER — Other Ambulatory Visit: Payer: Self-pay

## 2015-10-24 MED ORDER — METOPROLOL TARTRATE 25 MG PO TABS: 25.0000 mg | ORAL_TABLET | Freq: Two times a day (BID) | ORAL | Status: AC

## 2016-08-22 IMAGING — CR DG CHEST 2V
2 series · 2 of 2 positions shown · non-contrast
Comparison: None.

CLINICAL DATA: Preop testing, cardiac catheterization

EXAM:
CHEST  2 VIEW

[w chest pa]
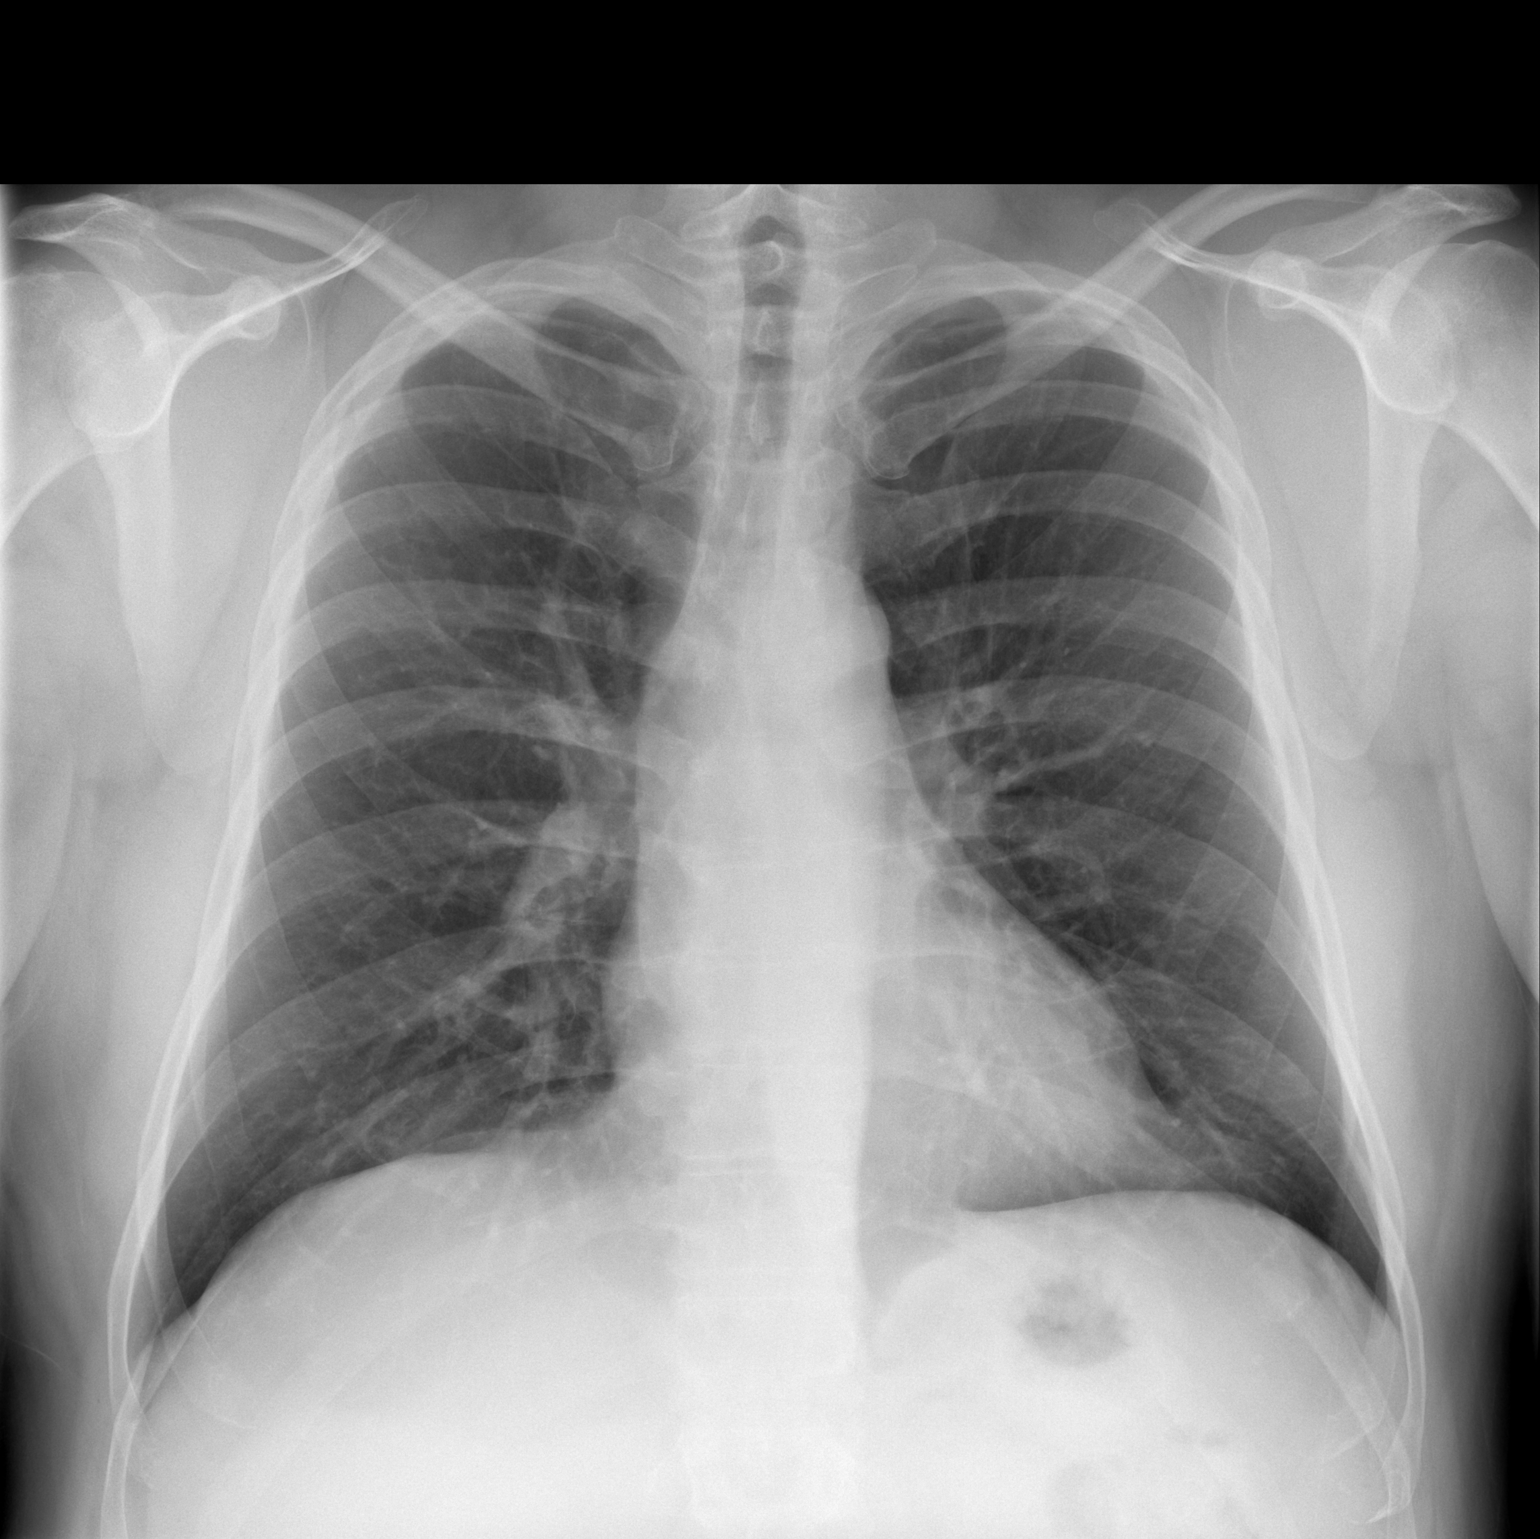

[w chest lat]
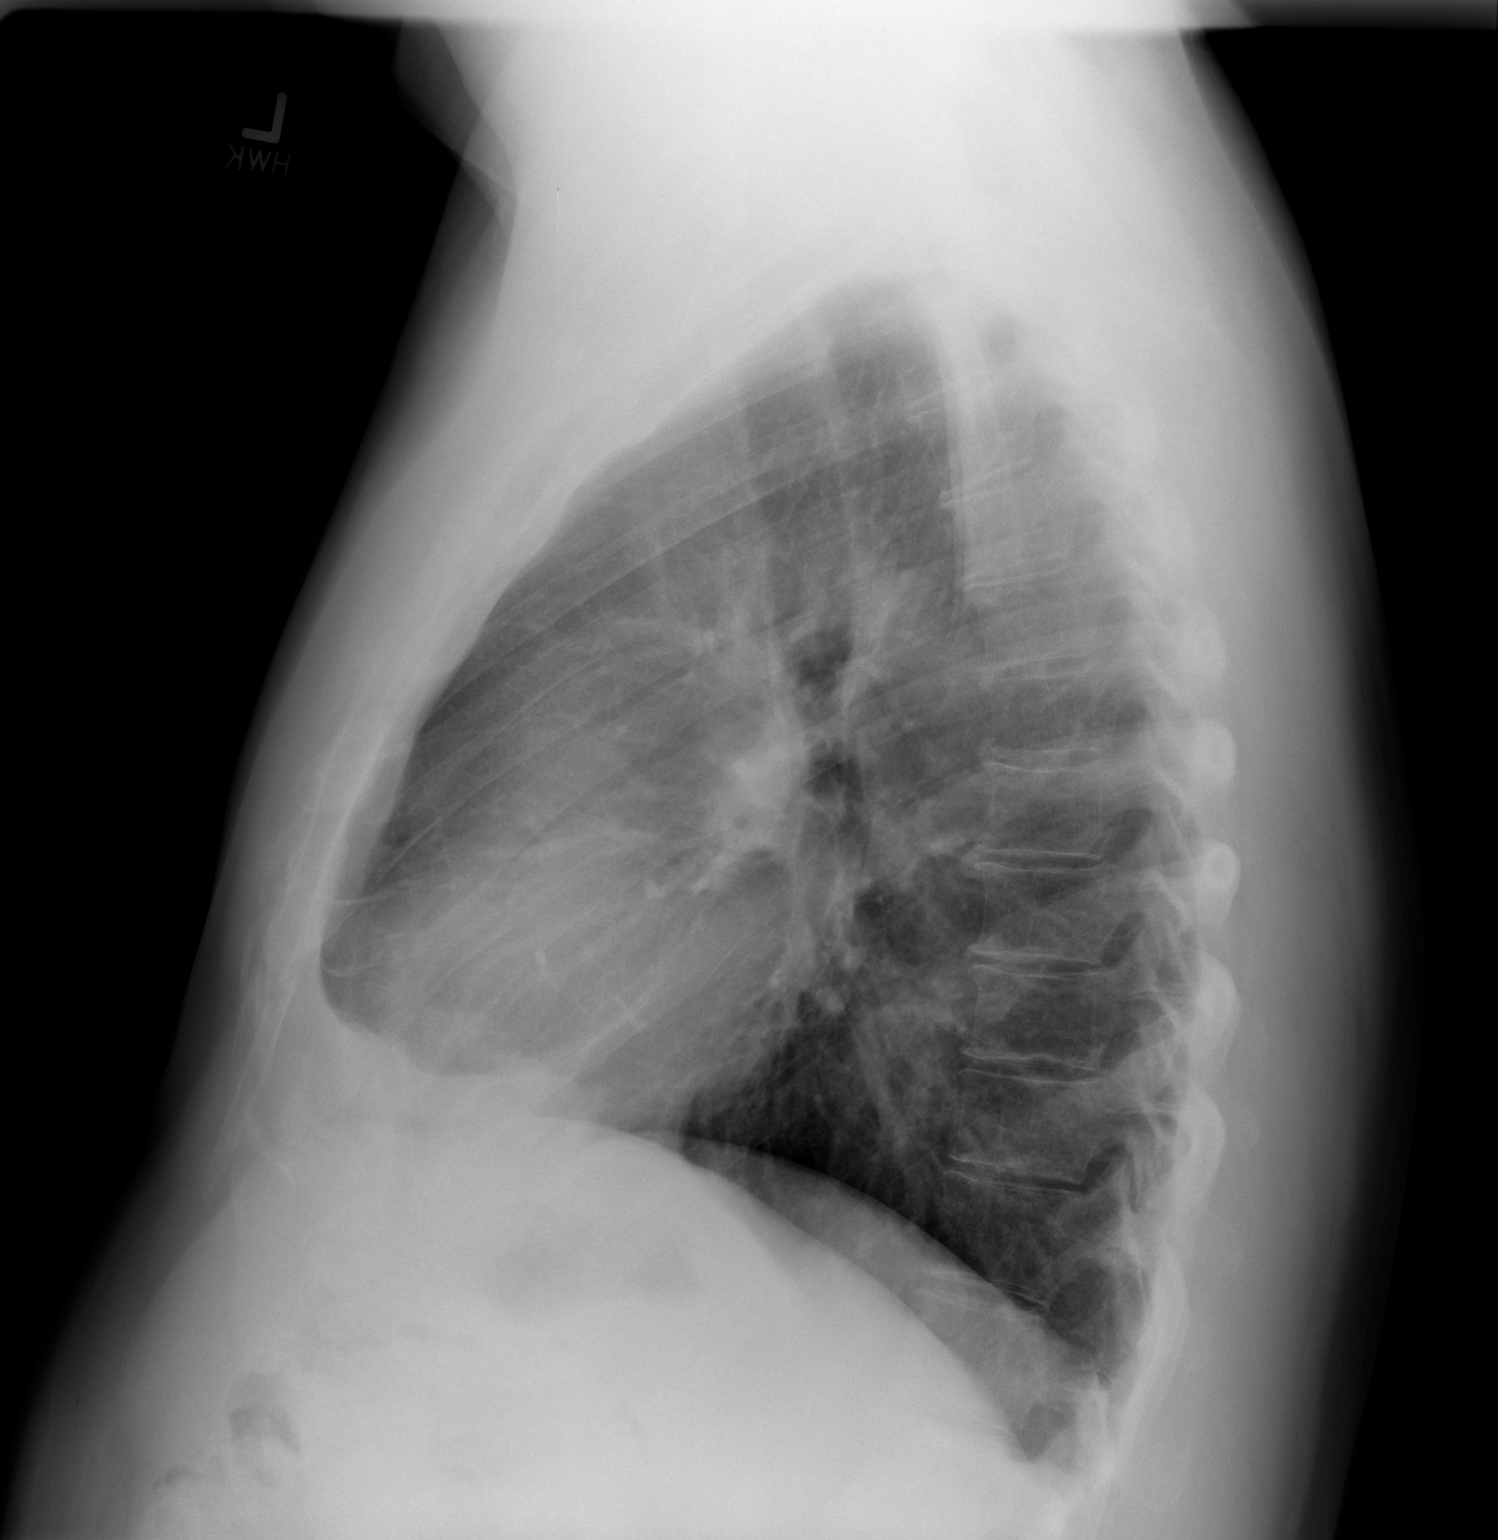

[2 of 2 positions shown; findings below may reference images not displayed]

FINDINGS: Cardiomediastinal silhouette is unremarkable. No acute infiltrate or
pleural effusion. No pulmonary edema. Mild degenerative changes
thoracic spine.
IMPRESSION: No active cardiopulmonary disease.

## 2019-04-27 ENCOUNTER — Ambulatory Visit
Admission: EM | Admit: 2019-04-27 | Discharge: 2019-04-27 | Disposition: A | Discharge status: Home / Self Care | Payer: BC Managed Care – PPO

## 2019-04-27 ENCOUNTER — Other Ambulatory Visit: Payer: Self-pay

## 2019-04-27 DIAGNOSIS — U071 COVID-19: Secondary | ICD-10-CM | POA: Diagnosis not present

## 2019-04-27 DIAGNOSIS — Z20822 Contact with and (suspected) exposure to covid-19: Secondary | ICD-10-CM

## 2019-04-27 LAB — POC SARS CORONAVIRUS 2 AG -  ED: SARS Coronavirus 2 Ag: POSITIVE — AB

## 2019-04-27 MED ORDER — CETIRIZINE HCL 10 MG PO TABS: 10.0000 mg | ORAL_TABLET | Freq: Every day | ORAL | 0 refills | Status: AC

## 2019-04-27 MED ORDER — FLUTICASONE PROPIONATE 50 MCG/ACT NA SUSP: 2.0000 | Freq: Every day | NASAL | 0 refills | Status: AC

## 2019-04-27 MED ORDER — BENZONATATE 100 MG PO CAPS: 100.0000 mg | ORAL_CAPSULE | Freq: Three times a day (TID) | ORAL | 0 refills | Status: AC

## 2019-04-27 NOTE — Discharge Instructions (Signed)
COVID test was positive You should remain isolated in your home for 10 days from symptom onset AND greater than 72 hours after symptoms resolution (absence of fever without the use of fever-reducing medication and improvement in respiratory symptoms), whichever is longer Get plenty of rest and push fluids Tessalon perles for cough Use zyrtec for nasal congestion, runny nose, and/or sore throat Use flonase for nasal congestion and runny nose Use medications daily for symptom relief Use OTC medications like ibuprofen or tylenol as needed fever or pain Follow up with PCP in 1-2 days via phone or e-visit for recheck and to ensure symptoms are improving Call or go to the ED if you have any new or worsening symptoms such as fever, worsening cough, shortness of breath, chest tightness, chest pain, turning blue, changes in mental status, etc..Marland Kitchen

## 2019-04-27 NOTE — ED Triage Notes (Signed)
Pts daughter is positive and pt has develop symptoms a few days ago

## 2019-04-27 NOTE — ED Provider Notes (Signed)
[place]   [id number] 04/27/19 Arrival Time: 5631   CC: COVID symptoms  SUBJECTIVE: History from: patient.  Kester L Schor is a 58 y.o. male who presents with dry cough, HA, body aches, fever with tmax of 101, and fatigue that began 1 week.  Daughter tested positive for COVID.  Last exposure 2 days ago.   Denies recent travel.  Has tried OTC medication like motrin, tylenol and nyquil with minimal relief.  Denies aggravating factors.  Denies previous symptoms in the past.   Denies fever, chills, nasal congestion, rhinorrhea, sore throat, SOB, wheezing, chest pain, nausea, vomiting, changes in bowel or bladder habits.    ROS: As per HPI.  All other pertinent ROS negative.     Past Medical History:  Diagnosis Date  . Chest pain   . Diabetes mellitus   . Family history of heart disease   . Hypercholesteremia   . Hypertension    Past Surgical History:  Procedure Laterality Date  . LEFT HEART CATHETERIZATION WITH CORONARY ANGIOGRAM N/A 06/04/2014   Procedure: LEFT HEART CATHETERIZATION WITH CORONARY ANGIOGRAM;  Surgeon: Lorretta Harp, MD;  Location: [facility] CATH LAB;  Service: Cardiovascular;  Laterality: N/A;   Allergies  Allergen Reactions  . Statins Other (See Comments)    Muscles aches and pain with weakness.   No current facility-administered medications on file prior to encounter.   Current Outpatient Medications on File Prior to Encounter  Medication Sig Dispense Refill  . Canagliflozin-metFORMIN HCl (INVOKAMET) 50-1000 MG TABS Take by mouth.    . Dulaglutide (TRULICITY) 1.5 SH/7.56YO SOPN Inject into the skin.    . pramipexole (MIRAPEX) 0.5 MG tablet Take 0.5 mg by mouth 3 (three) times daily.    . rosuvastatin (CRESTOR) 10 MG tablet Take 10 mg by mouth daily.    Marland Kitchen aspirin 81 MG tablet Take 81 mg by mouth daily.      Marland Kitchen buPROPion (WELLBUTRIN XL) 300 MG 24 hr tablet Take 300 mg by mouth daily.  3  . Cholecalciferol (VITAMIN D3) 2000 UNITS TABS Take 2,000 Units  by mouth daily.    Marland Kitchen glimepiride (AMARYL) 4 MG tablet Take 4 mg by mouth daily before breakfast.      . lisinopril-hydrochlorothiazide (PRINZIDE,ZESTORETIC) 20-25 MG per tablet Take 1 tablet by mouth daily.      . metFORMIN (GLUCOPHAGE) 1000 MG tablet Take 1,000 mg by mouth 2 (two) times daily with a meal.      . metoprolol tartrate (LOPRESSOR) 25 MG tablet Take 1 tablet (25 mg total) by mouth 2 (two) times daily. PLEASE CONTACT OFFICE FOR ADDITIONAL REFILLS FINAL WARNING 15 tablet 0  . omeprazole (PRILOSEC) 20 MG capsule Take 20 mg by mouth daily.   11  . testosterone cypionate (DEPOTESTOTERONE CYPIONATE) 200 MG/ML injection Inject 200 mg into the muscle every 14 (fourteen) days.     . TRADJENTA 5 MG TABS tablet Take 5 mg by mouth daily.   3  . ZETIA 10 MG tablet TAKE 1 TABLET BY MOUTH EVERY DAY 30 tablet 0   Social History   Socioeconomic History  . Marital status: Married    Spouse name: Not on file  . Number of children: Not on file  . Years of education: Not on file  . Highest education level: Not on file  Occupational History  . Not on file  Tobacco Use  . Smoking status: Never Smoker  Substance and Sexual Activity  . Alcohol use: No  . Drug use: No  .  Sexual activity: Not on file  Other Topics Concern  . Not on file  Social History Narrative  . Not on file   Social Determinants of Health   Financial Resource Strain:   . Difficulty of Paying Living Expenses: Not on file  Food Insecurity:   . Worried About Charity fundraiser in the Last Year: Not on file  . Ran Out of Food in the Last Year: Not on file  Transportation Needs:   . Lack of Transportation (Medical): Not on file  . Lack of Transportation (Non-Medical): Not on file  Physical Activity:   . Days of Exercise per Week: Not on file  . Minutes of Exercise per Session: Not on file  Stress:   . Feeling of Stress : Not on file  Social Connections:   . Frequency of Communication with Friends and Family: Not on  file  . Frequency of Social Gatherings with Friends and Family: Not on file  . Attends Religious Services: Not on file  . Active Member of Clubs or Organizations: Not on file  . Attends Archivist Meetings: Not on file  . Marital Status: Not on file  Intimate Partner Violence:   . Fear of Current or Ex-Partner: Not on file  . Emotionally Abused: Not on file  . Physically Abused: Not on file  . Sexually Abused: Not on file   Family History  Problem Relation Age of Onset  . Cancer Mother   . Heart attack Father   . Heart disease Father   . Arrhythmia Brother     OBJECTIVE:  Vitals:   04/27/19 1153  BP: (!) 147/100  Pulse: 100  Resp: 20  Temp: 99.5 F (37.5 C)  SpO2: 94%     General appearance: alert; appears mildly fatigued, but nontoxic; speaking in full sentences and tolerating own secretions HEENT: NCAT; Ears: EACs clear, TMs pearly gray; Eyes: PERRL.  EOM grossly intact. Sinuses: nontender; Nose: nares patent without rhinorrhea, Throat: oropharynx clear, tonsils non erythematous or enlarged, uvula midline  Neck: supple without LAD Lungs: unlabored respirations, symmetrical air entry; cough: mild; no respiratory distress; CTAB Heart: regular rate and rhythm.   Skin: warm and dry Psychological: alert and cooperative; normal mood and affect  LABS:  Results for orders placed or performed during the hospital encounter of 04/27/19 (from the past 24 hour(s))  POC SARS Coronavirus 2 Ag-ED - Nasal Swab (BD Veritor Kit)     Status: Abnormal   Collection Time: 04/27/19 12:00 PM  Result Value Ref Range   SARS Coronavirus 2 Ag Positive (A) Negative     ASSESSMENT & PLAN:  1. Suspected COVID-19 virus infection   2. COVID-19 virus infection   3. Exposure to COVID-19 virus     Meds ordered this encounter  Medications  . cetirizine (ZYRTEC) 10 MG tablet    Sig: Take 1 tablet (10 mg total) by mouth daily.    Dispense:  30 tablet    Refill:  0    Order Specific  Question:   Supervising Provider    Answer:   Raylene Everts [[id number]]  . fluticasone (FLONASE) 50 MCG/ACT nasal spray    Sig: Place 2 sprays into both nostrils daily.    Dispense:  16 g    Refill:  0    Order Specific Question:   Supervising Provider    Answer:   Raylene Everts [[id number]]  . benzonatate (TESSALON) 100 MG capsule    Sig: Take 1 capsule (  100 mg total) by mouth every 8 (eight) hours.    Dispense:  21 capsule    Refill:  0    Order Specific Question:   Supervising Provider    Answer:   Raylene Everts [[id number]]    COVID test was positive You should remain isolated in your home for 10 days from symptom onset AND greater than 72 hours after symptoms resolution (absence of fever without the use of fever-reducing medication and improvement in respiratory symptoms), whichever is longer Get plenty of rest and push fluids Tessalon perles for cough Use zyrtec for nasal congestion, runny nose, and/or sore throat Use flonase for nasal congestion and runny nose Use medications daily for symptom relief Use OTC medications like ibuprofen or tylenol as needed fever or pain Follow up with PCP in 1-2 days via phone or e-visit for recheck and to ensure symptoms are improving Call or go to the ED if you have any new or worsening symptoms such as fever, worsening cough, shortness of breath, chest tightness, chest pain, turning blue, changes in mental status, etc...    Reviewed expectations re: course of current medical issues. Questions answered. Outlined signs and symptoms indicating need for more acute intervention. Patient verbalized understanding. After Visit Summary given.         Lestine Box, PA-C 04/27/19 1835

## 2019-04-28 ENCOUNTER — Encounter: Payer: Self-pay | Admitting: Infectious Diseases

## 2022-01-27 ENCOUNTER — Encounter: Payer: Self-pay | Admitting: Emergency Medicine

## 2022-01-27 ENCOUNTER — Ambulatory Visit: Payer: Self-pay

## 2022-01-27 ENCOUNTER — Ambulatory Visit
Admission: EM | Admit: 2022-01-27 | Discharge: 2022-01-27 | Disposition: A | Discharge status: Home / Self Care | Payer: BC Managed Care – PPO | Attending: Family Medicine | Admitting: Family Medicine

## 2022-01-27 ENCOUNTER — Other Ambulatory Visit: Payer: Self-pay

## 2022-01-27 DIAGNOSIS — S0501XA Injury of conjunctiva and corneal abrasion without foreign body, right eye, initial encounter: Secondary | ICD-10-CM | POA: Diagnosis not present

## 2022-01-27 MED ORDER — ERYTHROMYCIN 5 MG/GM OP OINT: TOPICAL_OINTMENT | OPHTHALMIC | 0 refills | Status: AC

## 2022-01-27 NOTE — ED Triage Notes (Addendum)
Pt reports was cutting wood last night and reports right eye discomfort ever since and states "I'm not sure if I have saw dust in it or what". Denies any visual changes. Moderate redness noted to right eye.

## 2022-01-27 NOTE — ED Provider Notes (Signed)
[facility]    CSN: [id number] Arrival date & time: 01/27/22  1400      History   Chief Complaint Chief Complaint  Patient presents with   Eye Problem    HPI Dennis Hines is a 61 y.o. male.   Patient presenting today with 1 day history of right eye redness, discomfort after getting some sawdust in his eye while cutting some dusty wood.  He states he flushed his eye out as soon as he could but has continued to have irritation, tearing, redness.  Denies visual change, headache, nausea, vomiting, obvious foreign body retained in the eye.    Past Medical History:  Diagnosis Date   Chest pain    Diabetes mellitus    Family history of heart disease    Hypercholesteremia    Hypertension     Patient Active Problem List   Diagnosis Date Noted   S/P cardiac cath 07/04/2014   Pain in the chest    Positive cardiac stress test    Chest pain 05/11/2014   Diabetes (HCC) 05/11/2014   Essential hypertension 05/11/2014   Hyperlipidemia 05/11/2014    Past Surgical History:  Procedure Laterality Date   LEFT HEART CATHETERIZATION WITH CORONARY ANGIOGRAM N/A 06/04/2014   Procedure: LEFT HEART CATHETERIZATION WITH CORONARY ANGIOGRAM;  Surgeon: Runell Gess, MD;  Location: [facility] CATH LAB;  Service: Cardiovascular;  Laterality: N/A;       Home Medications    Prior to Admission medications   Medication Sig Start Date End Date Taking? Authorizing Provider  erythromycin ophthalmic ointment Place a 1/2 inch ribbon of ointment into the right lower eyelid BID prn. 01/27/22  Yes Particia Nearing, PA-C  aspirin 81 MG tablet Take 81 mg by mouth daily.      [provider]  benzonatate (TESSALON) 100 MG capsule Take 1 capsule (100 mg total) by mouth every 8 (eight) hours. 04/27/19   Wurst, [place], PA-C  buPROPion (WELLBUTRIN XL) 300 MG 24 hr tablet Take 300 mg by mouth daily. 03/31/14   [provider]  Canagliflozin-metFORMIN HCl (INVOKAMET) 50-1000  MG TABS Take by mouth. 11/17/18   [provider]  cetirizine (ZYRTEC) 10 MG tablet Take 1 tablet (10 mg total) by mouth daily. 04/27/19   Wurst, [place], PA-C  Cholecalciferol (VITAMIN D3) 2000 UNITS TABS Take 2,000 Units by mouth daily.    [provider]  Dulaglutide (TRULICITY) 1.5 MG/0.5ML SOPN Inject into the skin. 11/17/18   [provider]  fluticasone (FLONASE) 50 MCG/ACT nasal spray Place 2 sprays into both nostrils daily. 04/27/19   Wurst, [place], PA-C  glimepiride (AMARYL) 4 MG tablet Take 4 mg by mouth daily before breakfast.      [provider]  lisinopril-hydrochlorothiazide (PRINZIDE,ZESTORETIC) 20-25 MG per tablet Take 1 tablet by mouth daily.      [provider]  metFORMIN (GLUCOPHAGE) 1000 MG tablet Take 1,000 mg by mouth 2 (two) times daily with a meal.      [provider]  metoprolol tartrate (LOPRESSOR) 25 MG tablet Take 1 tablet (25 mg total) by mouth 2 (two) times daily. PLEASE CONTACT OFFICE FOR ADDITIONAL REFILLS FINAL WARNING 10/24/15   Runell Gess, MD  omeprazole (PRILOSEC) 20 MG capsule Take 20 mg by mouth daily.  04/07/14   [provider]  pramipexole (MIRAPEX) 0.5 MG tablet Take 0.5 mg by mouth 3 (three) times daily.    [provider]  rosuvastatin (CRESTOR) 10 MG tablet Take 10 mg  by mouth daily.    [provider]  testosterone cypionate (DEPOTESTOTERONE CYPIONATE) 200 MG/ML injection Inject 200 mg into the muscle every 14 (fourteen) days.  01/17/14 01/17/15  [provider]  TRADJENTA 5 MG TABS tablet Take 5 mg by mouth daily.  03/13/14   [provider]  ZETIA 10 MG tablet TAKE 1 TABLET BY MOUTH EVERY DAY 05/31/15   Runell Gess, MD    Family History Family History  Problem Relation Age of Onset   Cancer Mother    Heart attack Father    Heart disease Father    Arrhythmia Brother     Social History Social History   Tobacco Use   Smoking status:  Never  Substance Use Topics   Alcohol use: No   Drug use: No     Allergies   Statins   Review of Systems Review of Systems Per HPI  Physical Exam Triage Vital Signs ED Triage Vitals  Enc Vitals Group     BP 01/27/22 1436 (!) 173/90     Pulse Rate 01/27/22 1436 72     Resp 01/27/22 1436 20     Temp 01/27/22 1436 98.2 F (36.8 C)     Temp Source 01/27/22 1436 Oral     SpO2 01/27/22 1436 98 %     Weight --      Height --      Head Circumference --      Peak Flow --      Pain Score 01/27/22 1437 8     Pain Loc --      Pain Edu? --      Excl. in GC? --    No data found.  Updated Vital Signs BP (!) 173/90 (BP Location: Right Arm)   Pulse 72   Temp 98.2 F (36.8 C) (Oral)   Resp 20   SpO2 98%   Visual Acuity Right Eye Distance: 20/20 Left Eye Distance: 20/20 Bilateral Distance: 20/30  Right Eye Near:   Left Eye Near:    Bilateral Near:     Physical Exam Vitals and nursing note reviewed.  Constitutional:      Appearance: Normal appearance.  HENT:     Head: Atraumatic.     Mouth/Throat:     Mouth: Mucous membranes are moist.  Eyes:     Extraocular Movements: Extraocular movements intact.     Pupils: Pupils are equal, round, and reactive to light.     Comments: Right conjunctiva diffusely erythematous, injected, clear drainage present.  On fluorescein stain, multiple areas of increased uptake no foreign body appreciable on exam  Cardiovascular:     Rate and Rhythm: Normal rate and regular rhythm.  Pulmonary:     Effort: Pulmonary effort is normal.     Breath sounds: Normal breath sounds.  Musculoskeletal:        General: Normal range of motion.     Cervical back: Normal range of motion and neck supple.  Skin:    General: Skin is warm and dry.  Neurological:     General: No focal deficit present.     Mental Status: He is oriented to person, place, and time.  Psychiatric:        Mood and Affect: Mood normal.        Thought Content: Thought content  normal.        Judgment: Judgment normal.      UC Treatments / Results  Labs (all labs ordered are listed, but only  abnormal results are displayed) Labs Reviewed - No data to display  EKG   Radiology No results found.  Procedures Procedures (including critical care time)  Medications Ordered in UC Medications - No data to display  Initial Impression / Assessment and Plan / UC Course  I have reviewed the triage vital signs and the nursing notes.  Pertinent labs & imaging results that were available during my care of the patient were reviewed by me and considered in my medical decision making (see chart for details).     Distant with corneal abrasion, treat with erythromycin ointment, warm compresses, ibuprofen and Tylenol.  Ophthalmology follow-up if worsening symptoms.  Visual acuity very reassuring.  Final Clinical Impressions(s) / UC Diagnoses   Final diagnoses:  Abrasion of right cornea, initial encounter   Discharge Instructions   None    ED Prescriptions     Medication Sig Dispense Auth. Provider   erythromycin ophthalmic ointment Place a 1/2 inch ribbon of ointment into the right lower eyelid BID prn. 3.5 g Volney American, PA-C      PDMP not reviewed this encounter.   Volney American, [place] 01/27/22 1536
